# Patient Record
Sex: Female | Born: 1970 | Race: Black or African American | Hispanic: No | Marital: Single | Smoking: Never smoker
Health system: Southern US, Community
[De-identification: ages and names within clinical notes are randomized; demographics above are authoritative.]

## PROBLEM LIST (undated history)

## (undated) DIAGNOSIS — I10 Essential (primary) hypertension: Secondary | ICD-10-CM

## (undated) HISTORY — PX: HERNIA REPAIR: SHX51

---

## 2017-04-28 ENCOUNTER — Emergency Department: Payer: Self-pay

## 2017-04-28 ENCOUNTER — Emergency Department
Admission: EM | Admit: 2017-04-28 | Discharge: 2017-04-28 | Disposition: A | Discharge status: Home / Self Care | Payer: Self-pay | Attending: Emergency Medicine | Admitting: Emergency Medicine

## 2017-04-28 ENCOUNTER — Encounter: Payer: Self-pay | Admitting: Emergency Medicine

## 2017-04-28 DIAGNOSIS — I10 Essential (primary) hypertension: Secondary | ICD-10-CM | POA: Insufficient documentation

## 2017-04-28 DIAGNOSIS — Z79899 Other long term (current) drug therapy: Secondary | ICD-10-CM | POA: Insufficient documentation

## 2017-04-28 DIAGNOSIS — Z7982 Long term (current) use of aspirin: Secondary | ICD-10-CM | POA: Insufficient documentation

## 2017-04-28 HISTORY — DX: Essential (primary) hypertension: I10

## 2017-04-28 LAB — TROPONIN I

## 2017-04-28 LAB — BASIC METABOLIC PANEL
Anion gap: 8 (ref 5–15)
BUN: 12 mg/dL (ref 6–20)
CALCIUM: 8.6 mg/dL — AB (ref 8.9–10.3)
CO2: 22 mmol/L (ref 22–32)
CREATININE: 0.94 mg/dL (ref 0.44–1.00)
Chloride: 107 mmol/L (ref 101–111)
GFR calc Af Amer: >60 mL/min (ref >60)
GFR calc non Af Amer: >60 mL/min (ref >60)
GLUCOSE: 88 mg/dL (ref 65–99)
Potassium: 3.8 mmol/L (ref 3.5–5.1)
Sodium: 137 mmol/L (ref 135–145)

## 2017-04-28 LAB — CBC
HCT: 30.8 % — ABNORMAL LOW (ref 35.0–47.0)
HEMOGLOBIN: 9.8 g/dL — AB (ref 12.0–16.0)
MCH: 24.4 pg — ABNORMAL LOW (ref 26.0–34.0)
MCHC: 31.7 g/dL — AB (ref 32.0–36.0)
MCV: 77.1 fL — ABNORMAL LOW (ref 80.0–100.0)
Platelets: 390 10*3/uL (ref 150–440)
RBC: 4 MIL/uL (ref 3.80–5.20)
RDW: 17.8 % — ABNORMAL HIGH (ref 11.5–14.5)
WBC: 7.7 10*3/uL (ref 3.6–11.0)

## 2017-04-28 NOTE — ED Provider Notes (Addendum)
[facility] Emergency Department Provider Note  ____________________________________________   I have reviewed the triage vital signs and the nursing notes. Where available I have reviewed prior notes and, if possible and indicated, outside hospital notes.    HISTORY  Chief Complaint Hypertension    HPI Rachael Orozco is a 47 y.o. female with a history of hypertension and she states "stress" states that she was often noncompliant with her lisinopril.  She is supposed be taking 40-day.  She started back taking it from her doctor about 2 months ago.  However she went to the [facility] today put her hand in the machine and it read in the 150s.  This was concerning to her so she rechecked it it was in the 160s and she became more concerned and she rechecked and it was over 200 and she is been more concerned ever since.  Patient does state that she carries "stress".  She does not have any chest pain shortness breath nausea vomiting leg swelling and she has no headache change in vision or neurologic deficit.  She is absolutely asymptomatic with this but it is a concerning number to her.  She does state when specifically asked if she ever has chest pain that when she drives sometimes a seatbelt irritates her trapezius muscle and when she puts a seatbelt behind her and set of front of her, the trapezius muscle is relieved.  But she has no exertional symptoms, no chest pain no shortness of breath no pleuritic pain, no leg swelling no personal or family history of PE or DVT and she has no other complaints.  She is essentially here because she was concerned about the gradient blood pressure cuff readings on repeated assays.   Past Medical History:  Diagnosis Date  . Hypertension     There are no active problems to display for this patient.     Prior to Admission medications   Medication Sig Start Date End Date Taking? Authorizing Provider  aspirin EC 81 MG tablet Take 81 mg by  mouth daily.   Yes [provider]  lisinopril (PRINIVIL,ZESTRIL) 40 MG tablet Take 40 mg by mouth daily.   Yes [provider]    Allergies Patient has no known allergies.  No family history on file.  Social History Social History   Tobacco Use  . Smoking status: Never Smoker  . Smokeless tobacco: Never Used  Substance Use Topics  . Alcohol use: Never    Frequency: Never  . Drug use: Not on file    Review of Systems Constitutional: No fever/chills Eyes: No visual changes. ENT: No sore throat. No stiff neck no neck pain Cardiovascular: Denies chest pain. Respiratory: Denies shortness of breath. Gastrointestinal:   no vomiting.  No diarrhea.  No constipation. Genitourinary: Negative for dysuria. Musculoskeletal: Negative lower extremity swelling Skin: Negative for rash. Neurological: Negative for severe headaches, focal weakness or numbness.   ____________________________________________   PHYSICAL EXAM:  VITAL SIGNS: ED Triage Vitals  Enc Vitals Group     BP 04/28/17 1122 (!) 215/103     Pulse Rate 04/28/17 1122 68     Resp 04/28/17 1122 18     Temp 04/28/17 1122 98.5 F (36.9 C)     Temp Source 04/28/17 1122 Oral     SpO2 04/28/17 1122 100 %     Weight 04/28/17 1129 165 lb (74.8 kg)     Height 04/28/17 1129 5\' 4"  (1.626 m)     Head Circumference --  Peak Flow --      Pain Score 04/28/17 1129 0     Pain Loc --      Pain Edu? --      Excl. in GC? --     Constitutional: Alert and oriented. Well appearing and in no acute distress. Eyes: Conjunctivae are normal Head: Atraumatic HEENT: No congestion/rhinnorhea. Mucous membranes are moist.  Oropharynx non-erythematous Neck:   Very slight tenderness to palpation left trapezius muscle with ranging on palpation which reproduces the discomfort she describes when the seatbelt is on it.  With no meningismus, no masses, no stridor Cardiovascular: Normal rate, regular rhythm. Grossly normal  heart sounds.  Good peripheral circulation. Respiratory: Normal respiratory effort.  No retractions. Lungs CTAB. Abdominal: Soft and nontender. No distention. No guarding no rebound Back:  There is no focal tenderness or step off.  there is no midline tenderness there are no lesions noted. there is no CVA tenderness  Musculoskeletal: No lower extremity tenderness, no upper extremity tenderness. No joint effusions, no DVT signs strong distal pulses no edema Neurologic:  Normal speech and language. No gross focal neurologic deficits are appreciated.  Skin:  Skin is warm, dry and intact. No rash noted. Psychiatric: Mood and affect are somewhat anxious. Speech and behavior are normal.  ____________________________________________   LABS (all labs ordered are listed, but only abnormal results are displayed)  Labs Reviewed  BASIC METABOLIC PANEL - Abnormal; Notable for the following components:      Result Value   Calcium 8.6 (*)    All other components within normal limits  CBC - Abnormal; Notable for the following components:   Hemoglobin 9.8 (*)    HCT 30.8 (*)    MCV 77.1 (*)    MCH 24.4 (*)    MCHC 31.7 (*)    RDW 17.8 (*)    All other components within normal limits  TROPONIN I    Pertinent labs  results that were available during my care of the patient were reviewed by me and considered in my medical decision making (see chart for details). ____________________________________________  EKG  I personally interpreted any EKGs ordered by me or triage Normal sinus rhythm at 63 bpm no acute ST elevation or depression ossific ST changes noted, no old for comparison. ____________________________________________  RADIOLOGY  Pertinent labs & imaging results that were available during my care of the patient were reviewed by me and considered in my medical decision making (see chart for details). If possible, patient and/or family made aware of any abnormal findings.  Dg Chest 2  View  Result Date: 04/28/2017 CLINICAL DATA:  Chest tightness for 1 month. EXAM: CHEST - 2 VIEW COMPARISON:  None. FINDINGS: Borderline enlarged cardiac silhouette. Clear lungs with normal vascularity. Normal appearing bones. IMPRESSION: No acute abnormality. Electronically Signed   By: Beckie Salts M.D.   On: 04/28/2017 12:48   ____________________________________________    PROCEDURES  Procedure(s) performed: None  Procedures  Critical Care performed: None  ____________________________________________   INITIAL IMPRESSION / ASSESSMENT AND PLAN / ED COURSE  Pertinent labs & imaging results that were available during my care of the patient were reviewed by me and considered in my medical decision making (see chart for details).  Patient here with reproducible cervical/trapezius muscle tenderness which is she states exacerbated by her seatbelt and asymptomatic hypertension which certainly is made worse by her anxiety about her hypertension itself.  She has a very reassuring workup, nonspecific ST changes are noted for this reason  we did do cardiac enzymes which are negative.  Given his history of very low suspicion of ACS.  It is difficult to know exactly what to do for this patient, I do not know exactly what her baseline blood pressure is, she states is usually in the 150s.  She certainly is somewhat elevated today but she is very anxious about the fact that her blood pressure is elevated.  Usually the best thing to do in these cases is to stop taking her blood pressure.  Over ambitious or aggressive intervention for this kind of blood pressure my experience is often not in the patient's best interest.  There is no reason to admit the patient for these numbers alone and there is no indication for further workup at this time.  We will refer her to a cardiologist as a precaution given her hypertension.  She is compliant with her lisinopril at this time.  Kidney function is preserved etc.  This  does not appear to be a secondary hypertensive issue in regard to an organic process in her body that I can determine.  Patient very reassured by this blood pressure is already come down over 20 points just from relaxing.  I have advised her to continue to do so, not check her blood pressure at [facility], follow closely with her doctor.  ----------------------------------------- 1:18 PM on 04/28/2017 -----------------------------------------  His blood pressure is now down to 160 as she relaxes, that is of 50 point drop just from the release of stress.  I do not think starting new blood pressure medication in the emergency room is indicated, she is a symptomatically we will discharge her extensive return precautions follow-up given and understood.  At this time, there does not appear to be clinical evidence to support the diagnosis of pulmonary embolus, dissection, myocarditis, endocarditis, pericarditis, pericardial tamponade, acute coronary syndrome, pneumothorax, pneumonia, or any other acute intrathoracic pathology that will require admission or acute intervention. Nor is there evidence of any significant intra-abdominal pathology causing this discomfort. ____________________________________________   FINAL CLINICAL IMPRESSION(S) / ED DIAGNOSES  Final diagnoses:  None      This chart was dictated using voice recognition software.  Despite best efforts to proofread,  errors can occur which can change meaning.      Jeanmarie Plant, MD 04/28/17 1306    Jeanmarie Plant, MD 04/28/17 1318    Jeanmarie Plant, MD 04/28/17 1318

## 2017-04-28 NOTE — ED Triage Notes (Addendum)
Patient presents to the ED with hypertension.  Patient states she has had high blood pressure and taken blood pressure medications for several years but stopped taking them for approx. 6 months.  Patient reports beginning them again about 2 weeks ago and states she doesn't feel like the medication is helping.  Patient denies dizziness, headache, and blurry vision.  Patient reports being under a lot of stress, starting a new job and recently moving.  Patient also reports feeling occasional tightness in her left shoulder/left side of her neck.

## 2018-03-10 ENCOUNTER — Emergency Department: Payer: Self-pay

## 2018-03-10 ENCOUNTER — Other Ambulatory Visit: Payer: Self-pay

## 2018-03-10 ENCOUNTER — Encounter: Payer: Self-pay | Admitting: Emergency Medicine

## 2018-03-10 ENCOUNTER — Emergency Department
Admission: EM | Admit: 2018-03-10 | Discharge: 2018-03-10 | Disposition: A | Discharge status: Home / Self Care | Payer: Self-pay | Attending: Student in an Organized Health Care Education/Training Program | Admitting: Student in an Organized Health Care Education/Training Program

## 2018-03-10 DIAGNOSIS — I10 Essential (primary) hypertension: Secondary | ICD-10-CM | POA: Insufficient documentation

## 2018-03-10 DIAGNOSIS — R200 Anesthesia of skin: Secondary | ICD-10-CM | POA: Insufficient documentation

## 2018-03-10 DIAGNOSIS — M25512 Pain in left shoulder: Secondary | ICD-10-CM | POA: Insufficient documentation

## 2018-03-10 DIAGNOSIS — Z7982 Long term (current) use of aspirin: Secondary | ICD-10-CM | POA: Insufficient documentation

## 2018-03-10 DIAGNOSIS — Z79899 Other long term (current) drug therapy: Secondary | ICD-10-CM | POA: Insufficient documentation

## 2018-03-10 DIAGNOSIS — R0789 Other chest pain: Secondary | ICD-10-CM | POA: Insufficient documentation

## 2018-03-10 DIAGNOSIS — R202 Paresthesia of skin: Secondary | ICD-10-CM | POA: Insufficient documentation

## 2018-03-10 LAB — CBC
HEMATOCRIT: 34.3 % — AB (ref 36.0–46.0)
Hemoglobin: 10.3 g/dL — ABNORMAL LOW (ref 12.0–15.0)
MCH: 24.5 pg — AB (ref 26.0–34.0)
MCHC: 30 g/dL (ref 30.0–36.0)
MCV: 81.7 fL (ref 80.0–100.0)
Platelets: 457 10*3/uL — ABNORMAL HIGH (ref 150–400)
RBC: 4.2 MIL/uL (ref 3.87–5.11)
RDW: 17.8 % — ABNORMAL HIGH (ref 11.5–15.5)
WBC: 5.9 10*3/uL (ref 4.0–10.5)
nRBC: 0 % (ref 0.0–0.2)

## 2018-03-10 LAB — POCT PREGNANCY, URINE: PREG TEST UR: NEGATIVE

## 2018-03-10 LAB — TROPONIN I: Troponin I: <0.03 ng/mL (ref <0.03)

## 2018-03-10 LAB — BASIC METABOLIC PANEL
Anion gap: 5 (ref 5–15)
BUN: 8 mg/dL (ref 6–20)
CALCIUM: 8.9 mg/dL (ref 8.9–10.3)
CHLORIDE: 106 mmol/L (ref 98–111)
CO2: 27 mmol/L (ref 22–32)
CREATININE: 0.7 mg/dL (ref 0.44–1.00)
GFR calc Af Amer: >60 mL/min (ref >60)
GFR calc non Af Amer: >60 mL/min (ref >60)
GLUCOSE: 95 mg/dL (ref 70–99)
Potassium: 3.5 mmol/L (ref 3.5–5.1)
Sodium: 138 mmol/L (ref 135–145)

## 2018-03-10 LAB — FIBRIN DERIVATIVES D-DIMER (ARMC ONLY): Fibrin derivatives D-dimer (ARMC): 647.54 ng/mL (FEU) — ABNORMAL HIGH (ref 0.00–499.00)

## 2018-03-10 MED ORDER — CYCLOBENZAPRINE HCL 5 MG PO TABS
5.0000 mg | ORAL_TABLET | Freq: Three times a day (TID) | ORAL | 0 refills | Status: AC | PRN
Start: 2018-03-10 — End: ?

## 2018-03-10 MED ORDER — IOHEXOL 350 MG/ML SOLN
75.0000 mL | Freq: Once | INTRAVENOUS | Status: AC | PRN
  Administered 2018-03-10: 75 mL via INTRAVENOUS

## 2018-03-10 MED ORDER — SODIUM CHLORIDE 0.9% FLUSH: 3.0000 mL | Freq: Once | INTRAVENOUS | Status: DC

## 2018-03-10 MED ORDER — LIDOCAINE 5 % EX PTCH
1.0000 | MEDICATED_PATCH | CUTANEOUS | Status: DC
  Administered 2018-03-10: 1 via TRANSDERMAL
  Filled 2018-03-10: qty 1

## 2018-03-10 MED ORDER — IBUPROFEN 600 MG PO TABS
600.0000 mg | ORAL_TABLET | Freq: Once | ORAL | Status: AC
  Administered 2018-03-10: 600 mg via ORAL

## 2018-03-10 MED ORDER — HYDROCODONE-ACETAMINOPHEN 5-325 MG PO TABS: 1.0000 | ORAL_TABLET | Freq: Once | ORAL | Status: DC

## 2018-03-10 NOTE — ED Notes (Signed)
Pt back from CT

## 2018-03-10 NOTE — ED Notes (Signed)
Patient to Rm 19, Lisabeth Pick RN aware of placement.

## 2018-03-10 NOTE — ED Notes (Signed)
CT called and informed POC complete and pt ready for test

## 2018-03-10 NOTE — ED Notes (Signed)
Pt taken to CT.

## 2018-03-10 NOTE — ED Provider Notes (Signed)
[facility] Emergency Department Provider Note    First MD Initiated Contact with Patient 03/10/18 0935     (approximate)  I have reviewed the triage vital signs and the nursing notes.   HISTORY  Chief Complaint Chest Pain and Numbness    HPI Rachael Orozco is a 48 y.o. female is the ER for evaluation of left-sided anterior chest wall pain is well as left shoulder pain associated numbness or tingling in her fingertips.  States that she thought was initially muscle strain as the pain is reproduced with palpation.  Denies any headache.  No neck pain.  Did feel the pain shoot through to her shoulder this morning.  Does have a history of hypertension.  No history of heart disease.  She is not on a oral contraceptive pills.    Past Medical History:  Diagnosis Date  . Hypertension    No family history on file. Past Surgical History:  Procedure Laterality Date  . HERNIA REPAIR     There are no active problems to display for this patient.     Prior to Admission medications   Medication Sig Start Date End Date Taking? Authorizing Provider  aspirin EC 325 MG tablet Take 325 mg by mouth daily.    Yes [provider]  lisinopril (PRINIVIL,ZESTRIL) 40 MG tablet Take 40 mg by mouth daily.   Yes [provider]  cyclobenzaprine (FLEXERIL) 5 MG tablet Take 1 tablet (5 mg total) by mouth 3 (three) times daily as needed for muscle spasms. 03/10/18   Willy Eddyobinson, Emmalea Treanor, MD    Allergies Patient has no known allergies.    Social History Social History   Tobacco Use  . Smoking status: Never Smoker  . Smokeless tobacco: Never Used  Substance Use Topics  . Alcohol use: Never    Frequency: Never  . Drug use: Not on file    Review of Systems Patient denies headaches, rhinorrhea, blurry vision, numbness, shortness of breath, chest pain, edema, cough, abdominal pain, nausea, vomiting, diarrhea, dysuria, fevers, rashes or hallucinations unless  otherwise stated above in HPI. ____________________________________________   PHYSICAL EXAM:  VITAL SIGNS: Vitals:   03/10/18 1200 03/10/18 1300  BP: (!) 157/97 (!) 150/102  Pulse: 63 71  Resp: 19 19  Temp:    SpO2: 98% 99%    Constitutional: Alert and oriented.  Eyes: Conjunctivae are normal.  Head: Atraumatic. Nose: No congestion/rhinnorhea. Mouth/Throat: Mucous membranes are moist.   Neck: No stridor. Painless ROM.  Cardiovascular: Normal rate, regular rhythm. Grossly normal heart sounds.  Good peripheral circulation. Respiratory: Normal respiratory effort.  No retractions. Lungs CTAB. Gastrointestinal: Soft and nontender. No distention. No abdominal bruits. No CVA tenderness. Genitourinary:  Musculoskeletal: Tenderness palpation on the superior aspect of the left scapula.  Neurovascularly intact bilateral upper extremities.  No lower extremity tenderness nor edema.  No joint effusions. Neurologic:  Normal speech and language. No gross focal neurologic deficits are appreciated. No facial droop Skin:  Skin is warm, dry and intact. No rash noted. Psychiatric: Mood and affect are normal. Speech and behavior are normal.  ____________________________________________   LABS (all labs ordered are listed, but only abnormal results are displayed)  Results for orders placed or performed during the hospital encounter of 03/10/18 (from the past 24 hour(s))  Basic metabolic panel     Status: None   Collection Time: 03/10/18  9:00 AM  Result Value Ref Range   Sodium 138 135 - 145 mmol/L   Potassium 3.5 3.5 -  5.1 mmol/L   Chloride 106 98 - 111 mmol/L   CO2 27 22 - 32 mmol/L   Glucose, Bld 95 70 - 99 mg/dL   BUN 8 6 - 20 mg/dL   Creatinine, Ser 4.65 0.44 - 1.00 mg/dL   Calcium 8.9 8.9 - 68.1 mg/dL   GFR calc non Af Amer >60 >60 mL/min   GFR calc Af Amer >60 >60 mL/min   Anion gap 5 5 - 15  CBC     Status: Abnormal   Collection Time: 03/10/18  9:00 AM  Result Value Ref Range    WBC 5.9 4.0 - 10.5 K/uL   RBC 4.20 3.87 - 5.11 MIL/uL   Hemoglobin 10.3 (L) 12.0 - 15.0 g/dL   HCT 27.5 (L) 17.0 - 01.7 %   MCV 81.7 80.0 - 100.0 fL   MCH 24.5 (L) 26.0 - 34.0 pg   MCHC 30.0 30.0 - 36.0 g/dL   RDW 49.4 (H) 49.6 - 75.9 %   Platelets 457 (H) 150 - 400 K/uL   nRBC 0.0 0.0 - 0.2 %  Troponin I - ONCE - STAT     Status: None   Collection Time: 03/10/18  9:00 AM  Result Value Ref Range   Troponin I <0.03 <0.03 ng/mL  Fibrin derivatives D-Dimer ([facility] only)     Status: Abnormal   Collection Time: 03/10/18 10:23 AM  Result Value Ref Range   Fibrin derivatives D-dimer ([facility]) 647.54 (H) 0.00 - 499.00 ng/mL (FEU)  Troponin I - Once-Timed     Status: None   Collection Time: 03/10/18 11:54 AM  Result Value Ref Range   Troponin I <0.03 <0.03 ng/mL  Pregnancy, urine POC     Status: None   Collection Time: 03/10/18  1:18 PM  Result Value Ref Range   Preg Test, Ur NEGATIVE NEGATIVE   ____________________________________________  EKG My review and personal interpretation at Time: 8:50   Indication: chest pain  Rate: 65  Rhythm: sinus Axis: normal  Other: normal intervals, nonspecific t wave abn, no stemi ____________________________________________  RADIOLOGY  I personally reviewed all radiographic images ordered to evaluate for the above acute complaints and reviewed radiology reports and findings.  These findings were personally discussed with the patient.  Please see medical record for radiology report.  ____________________________________________   PROCEDURES  Procedure(s) performed:  Procedures    Critical Care performed: no ____________________________________________   INITIAL IMPRESSION / ASSESSMENT AND PLAN / ED COURSE  Pertinent labs & imaging results that were available during my care of the patient were reviewed by me and considered in my medical decision making (see chart for details).   DDX: ACS, pericarditis, esophagitis, boerhaaves, pe,  dissection, pna, bronchitis, costochondritis   Rachael Orozco is a 48 y.o. who presents to the ED with pain as described above.  Seems primarily musculoskeletal.  No focal deficits.  She is low risk by heart score but based on her presenting symptoms will order serial enzymes.  Doubt PE but does have some discomfort when taking deep inspiration which may be again musculoskeletal but given her presentation will order d-dimer to further re-stratify.  She does describe some vague shortness of breath.  No evidence pneumothorax or pneumonia.  Will provide pain medication and reassess.  Clinical Course as of Mar 11 1407  [facility] Mar 10, 2018  1138 D-dimer is elevated.  Will order CT angiogram to further evaluate.   [PR]  1407 Patient reassessed.  CT angiogram shows no evidence of dissection or  PE.  No evidence of pneumonia.  Seems primarily musculoskeletal.  Repeat troponin is negative.  This point do believe she stable and appropriate for outpatient follow-up.   [PR]    Clinical Course User Index [PR] Willy Eddy, MD     As part of my medical decision making, I reviewed the following data within the electronic MEDICAL RECORD NUMBER Nursing notes reviewed and incorporated, Labs reviewed, notes from prior ED visits and [place] Controlled Substance Database   ____________________________________________   FINAL CLINICAL IMPRESSION(S) / ED DIAGNOSES  Final diagnoses:  Acute pain of left shoulder      NEW MEDICATIONS STARTED DURING THIS VISIT:  New Prescriptions   CYCLOBENZAPRINE (FLEXERIL) 5 MG TABLET    Take 1 tablet (5 mg total) by mouth 3 (three) times daily as needed for muscle spasms.     Note:  This document was prepared using Dragon voice recognition software and may include unintentional dictation errors.    Willy Eddy, MD 03/10/18 1409

## 2018-03-10 NOTE — ED Notes (Signed)
Esign not working pt verbalized discharge instructions and has no questions at this time 

## 2018-03-10 NOTE — ED Notes (Signed)
First Nurse Note: Patient complaining of chest pain X 1 week, worse this AM with SHOB.  Also complaining of numbness on left side "a few days ago".

## 2018-03-10 NOTE — ED Triage Notes (Signed)
C/O left sided chest pain x 1 week.   Also left side numbness to face, arm, and leg since this morning at 0700.    AAOx3.  MAE equally and strong.  Facial movements equal.  Gait steady.  Speech clear. NAD

## 2020-10-02 IMAGING — CT CT ANGIO CHEST
2 of 6 series · 18 of 46 positions shown · IV contrast (APPLIED)
Comparison: Radiographs of same day.

CLINICAL DATA: Chest pain.

EXAM:
CT ANGIOGRAPHY CHEST WITH CONTRAST
TECHNIQUE: Multidetector CT imaging of the chest was performed using the
standard protocol during bolus administration of intravenous
contrast. Multiplanar CT image reconstructions and MIPs were
obtained to evaluate the vascular anatomy.
CONTRAST:  75mL OMNIPAQUE IOHEXOL 350 MG/ML SOLN

[Series 5: thins · axial · 0.62mm/px · z∈[-420,-179]mm · 15 of 265 slices shown]
[im 12/265  lung]
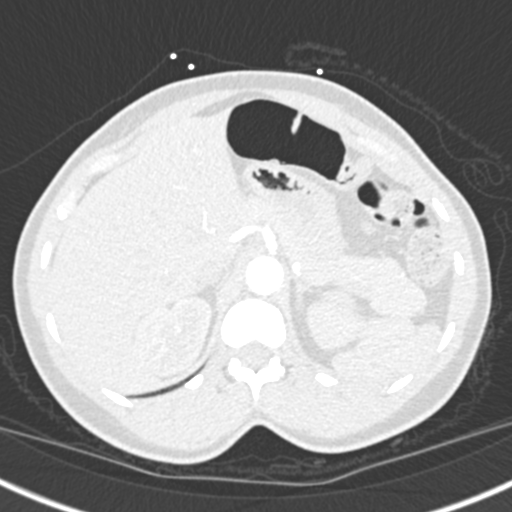
[im 35/265  soft-tissue]
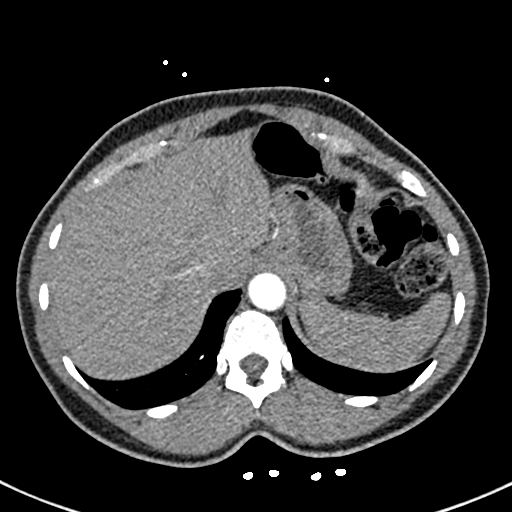
[im 46/265  lung]
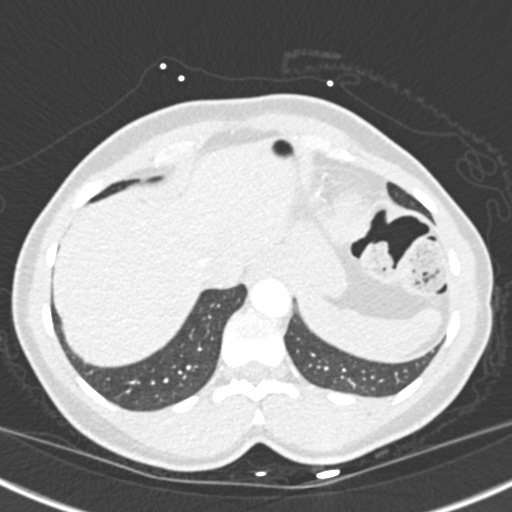
[im 69/265  soft-tissue]
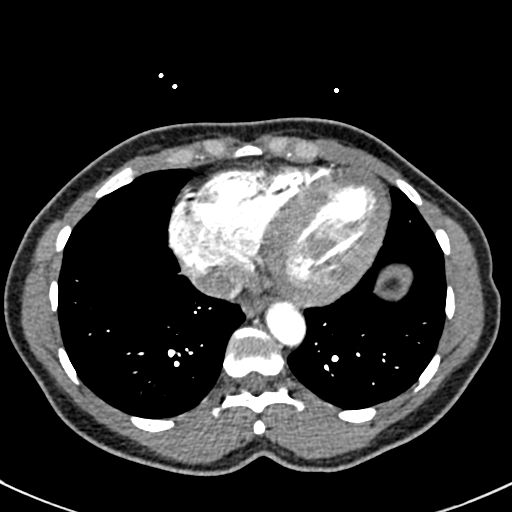
[im 81/265  lung]
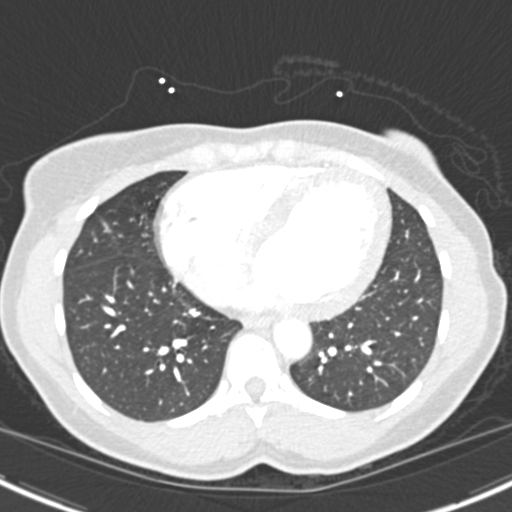
[im 104/265  soft-tissue]
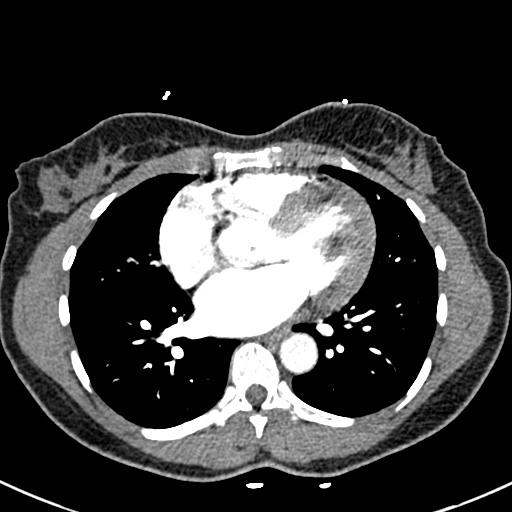
[im 115/265  lung]
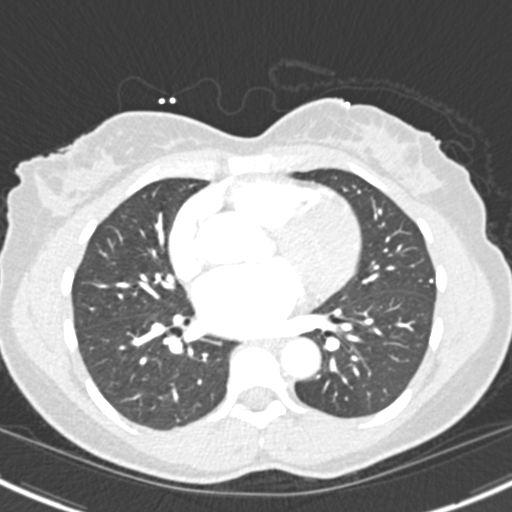
[im 138/265  soft-tissue]
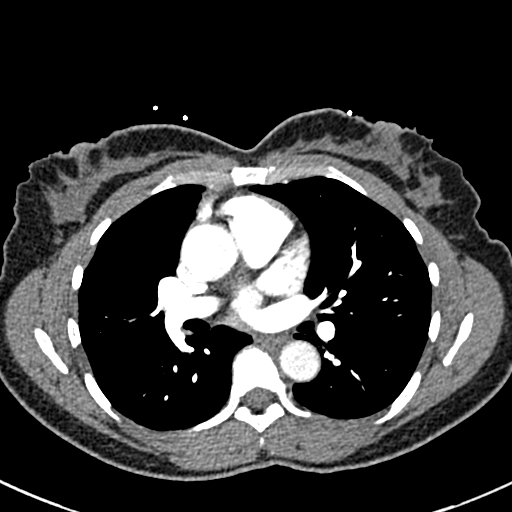
[im 150/265  lung]
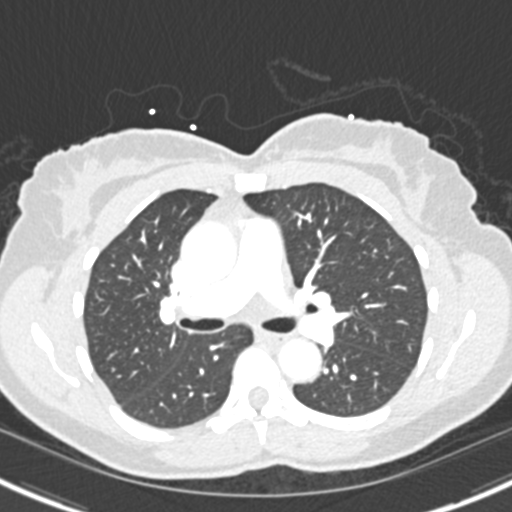
[im 161/265  soft-tissue]
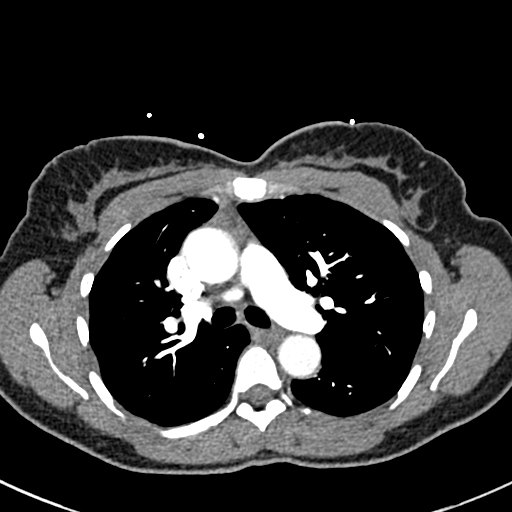
[im 184/265  lung]
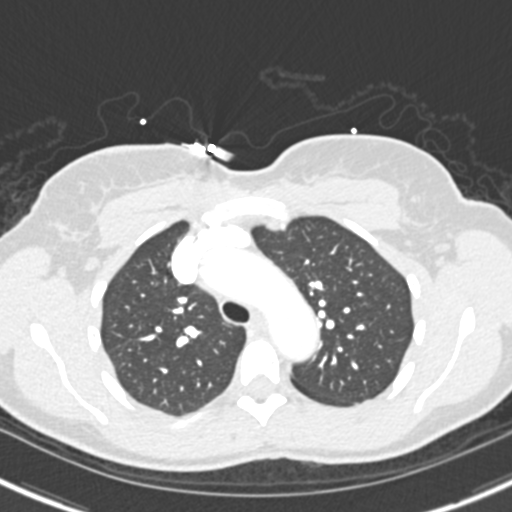
[im 196/265  soft-tissue]
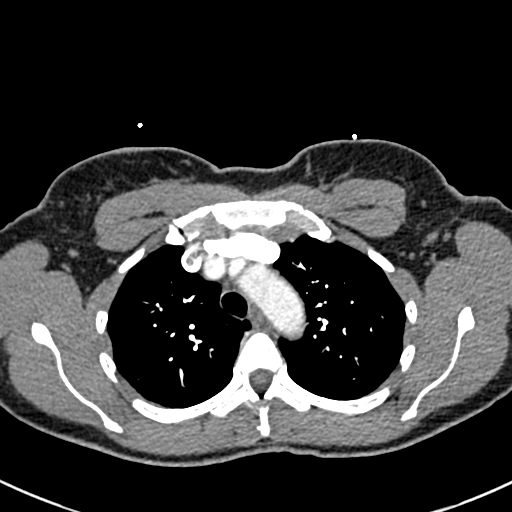
[im 219/265  lung]
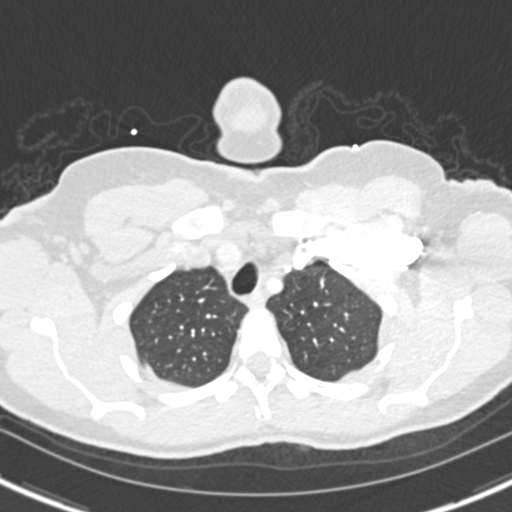
[im 230/265  soft-tissue]
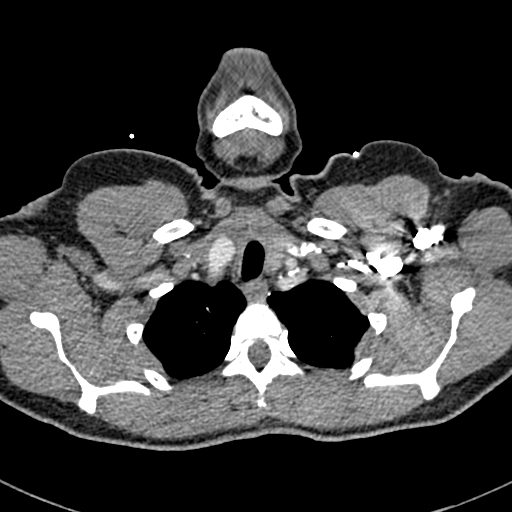
[im 253/265  lung]
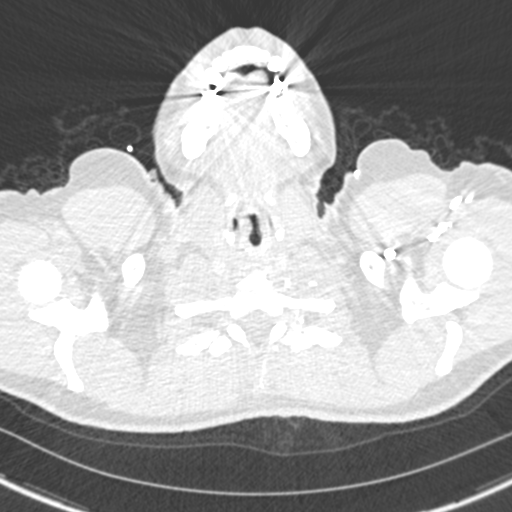

[Series 7: coronal mpr · coronal · 0.52mm/px · 3 of 79 slices shown]
[im 20/79  soft-tissue]
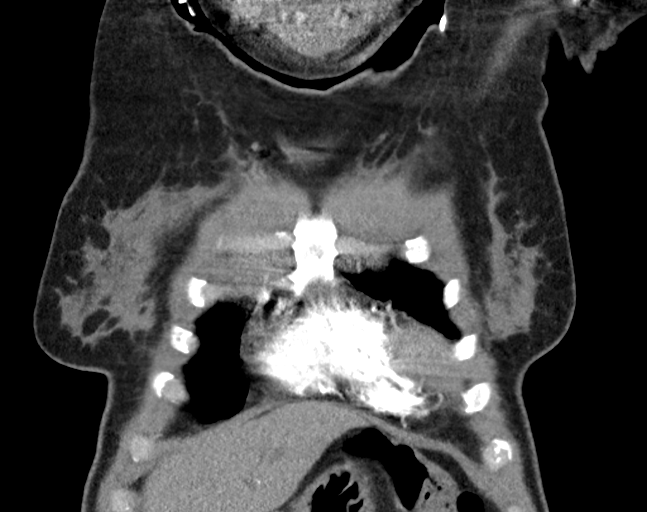
[im 40/79  soft-tissue]
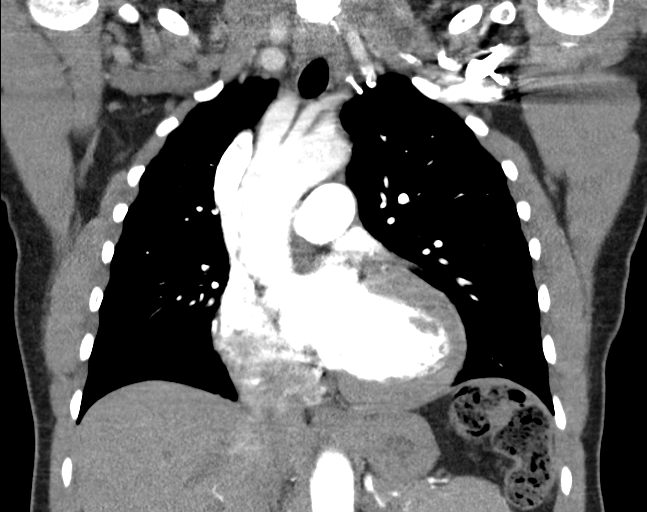
[im 59/79  soft-tissue]
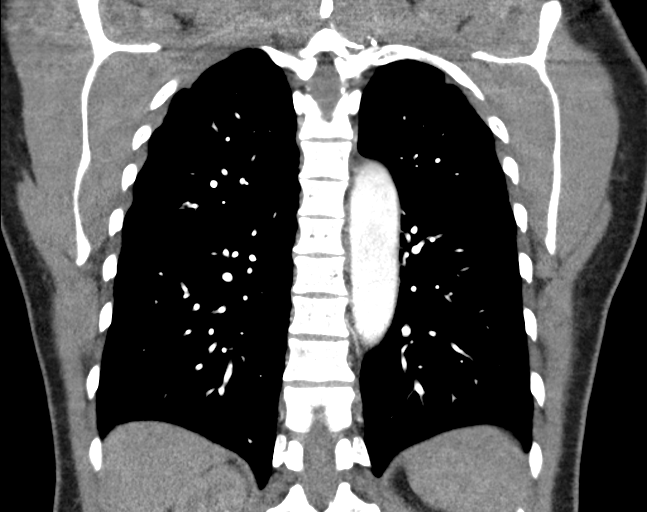

[18 of 46 positions shown; findings below may reference images not displayed]

FINDINGS: Cardiovascular: Satisfactory opacification of the pulmonary arteries
to the segmental level. No evidence of pulmonary embolism. Normal
heart size. No pericardial effusion.

Mediastinum/Nodes: No enlarged mediastinal, hilar, or axillary lymph
nodes. Thyroid gland, trachea, and esophagus demonstrate no
significant findings.

Lungs/Pleura: Lungs are clear. No pleural effusion or pneumothorax.

Upper Abdomen: No acute abnormality.

Musculoskeletal: No chest wall abnormality. No acute or significant
osseous findings.

Review of the MIP images confirms the above findings.
IMPRESSION: No definite evidence of pulmonary embolus. No acute cardiopulmonary
abnormality seen.
